# Patient Record
Sex: Female | Born: 1985 | Hispanic: Yes | Marital: Single | Smoking: Light tobacco smoker
Health system: Southern US, Community
[De-identification: ages and names within clinical notes are randomized; demographics above are authoritative.]

## PROBLEM LIST (undated history)

## (undated) HISTORY — PX: WISDOM TOOTH EXTRACTION: SHX21

---

## 2014-09-08 ENCOUNTER — Ambulatory Visit (INDEPENDENT_AMBULATORY_CARE_PROVIDER_SITE_OTHER): Payer: PRIVATE HEALTH INSURANCE | Admitting: Family Medicine

## 2014-09-08 VITALS — BP 100/70 | HR 89 | Temp 98.9°F | Resp 16 | Ht 60.0 in | Wt 161.5 lb

## 2014-09-08 DIAGNOSIS — M545 Low back pain, unspecified: Secondary | ICD-10-CM

## 2014-09-08 MED ORDER — PREDNISONE 20 MG PO TABS: ORAL_TABLET | ORAL | Status: AC

## 2014-09-08 MED ORDER — CYCLOBENZAPRINE HCL 5 MG PO TABS: 5.0000 mg | ORAL_TABLET | Freq: Every day | ORAL | Status: AC

## 2014-09-08 MED ORDER — CYCLOBENZAPRINE HCL 5 MG PO TABS: 5.0000 mg | ORAL_TABLET | Freq: Three times a day (TID) | ORAL | Status: DC | PRN

## 2014-09-08 MED ORDER — CYCLOBENZAPRINE HCL 5 MG PO TABS: 5.0000 mg | ORAL_TABLET | Freq: Every day | ORAL | Status: DC

## 2014-09-08 MED ORDER — PREDNISONE 20 MG PO TABS: ORAL_TABLET | ORAL | Status: DC

## 2014-09-08 NOTE — Progress Notes (Signed)
Patient ID: Tamara Munoz, female   DOB: 1985/08/12, 29 y.o.   MRN: [id number]   This chart was scribed for Elvina Sidle, MD by [facility], medical scribe at [facility].The patient was seen in exam room 08 and the patient's care was started at 6:09 PM.  Patient ID: Tamara Munoz MRN: [id number], DOB: February 15, 1986, 29 y.o. Date of Encounter: 09/08/2014  Primary Physician: No PCP Per Patient  Chief Complaint:  Chief Complaint  Patient presents with   Back Pain    x 2 days, no trauma   HPI:  Tamara Munoz is a 29 y.o. female who presents to [facility] complaining of centralized back pain that radiates down to her legs., onset two days ago. She does have a history of back injuries but no trauma. She woke up from a nap and she was unable to move due to the pain on Saturday. Sitting worsens her pain and movements relieves this but certain pains does worsen her pain. In the morning the pain radiated down her left leg and the afternoon pain radiated down to her right leg. She did take ibuprofen for relief. No menstrual periods due to medication. No urinary symptoms, abdominal pain chest pain, shortness of breath. She is an Print production planner, no heavy lifting.  History reviewed. No pertinent past medical history.   Home Meds: Prior to Admission medications   Not on File   Allergies: Not on File  History   Social History   Marital Status: Single    Spouse Name: N/A   Number of Children: N/A   Years of Education: N/A   Occupational History   Not on file.   Social History Main Topics   Smoking status: Light Tobacco Smoker   Smokeless tobacco: Never Used   Alcohol Use: 0.0 oz/week    0 Standard drinks or equivalent per week   Drug Use: Not on file   Sexual Activity: Not on file   Other Topics Concern   Not on file   Social History Narrative   No narrative on file    Review of Systems: Constitutional: negative for chills,  fever, night sweats, weight changes, or fatigue  HEENT: negative for vision changes, hearing loss, congestion, rhinorrhea, ST, epistaxis, or sinus pressure Cardiovascular: negative for chest pain or palpitations Respiratory: negative for hemoptysis, wheezing, shortness of breath, or cough Abdominal: negative for abdominal pain, nausea, vomiting, diarrhea, or constipation Dermatological: negative for rash Msk: positive for back pain. Neurologic: negative for headache, dizziness, or syncope All other systems reviewed and are otherwise negative with the exception to those above and in the HPI.  Physical Exam: Blood pressure 100/70, pulse 89, temperature 98.9 F (37.2 C), temperature source Oral, resp. rate 16, height 5' (1.524 m), weight 161 lb 8 oz (73.256 kg), SpO2 98 %., Body mass index is 31.54 kg/(m^2). General: Well developed, well nourished, in no acute distress. Head: Normocephalic, atraumatic, eyes without discharge, sclera non-icteric, nares are without discharge. Bilateral auditory canals clear, TM's are without perforation, pearly grey and translucent with reflective cone of light bilaterally. Oral cavity moist, posterior pharynx without exudate, erythema, peritonsillar abscess, or post nasal drip.  Neck: Supple. No thyromegaly. Full ROM. No lymphadenopathy. Lungs: Clear bilaterally to auscultation without wheezes, rales, or rhonchi. Breathing is unlabored. Heart: RRR with S1 S2. No murmurs, rubs, or gallops appreciated. Abdomen: Soft, non-tender, non-distended with normoactive bowel sounds. No hepatomegaly. No rebound/guarding. No obvious abdominal masses. Msk:  Strength and tone  normal for age. No tenderness in her back Extremities/Skin: Warm and dry. No clubbing or cyanosis. No edema. No rashes or suspicious lesions. Straight leg raise is normal bilaterally.  Neuro: Alert and oriented X 3. Moves all extremities spontaneously. Gait is normal. CNII-XII grossly in tact. Reflexes are  normal Psych:  Responds to questions appropriately with a normal affect.   Labs:  ASSESSMENT AND PLAN:  29 y.o. year old female with 48 hours of low back pain without precipitating cause. She has no symptoms suggesting pelvic inflammatory disease, urinary tract infection, or disc disease.  This chart was scribed in my presence and reviewed by me personally.    ICD-9-CM ICD-10-CM   1. Midline low back pain without sciatica 724.2 M54.5 cyclobenzaprine (FLEXERIL) 5 MG tablet     predniSONE (DELTASONE) 20 MG tablet     DISCONTINUED: cyclobenzaprine (FLEXERIL) 5 MG tablet     DISCONTINUED: predniSONE (DELTASONE) 20 MG tablet     Signed, Elvina Sidle, MD  Signed, Elvina Sidle, MD 09/08/2014 6:09 PM

## 2014-09-08 NOTE — Addendum Note (Signed)
Addended by: Elvina Sidle on: 09/08/2014 06:23 PM   Modules accepted: Orders

## 2014-09-08 NOTE — Patient Instructions (Signed)
Without specific localized findings and with spontaneous onset (no trauma fall) sees, I believe that the problem is an inflammatory process where the muscles of the back attached to the top of the posterior pelvis. This usually improves with medicine prescribed. Please call back if it is not significantly better in 48 hours

## 2014-10-09 ENCOUNTER — Ambulatory Visit (INDEPENDENT_AMBULATORY_CARE_PROVIDER_SITE_OTHER): Payer: PRIVATE HEALTH INSURANCE

## 2014-10-09 ENCOUNTER — Ambulatory Visit (INDEPENDENT_AMBULATORY_CARE_PROVIDER_SITE_OTHER): Payer: PRIVATE HEALTH INSURANCE | Admitting: Emergency Medicine

## 2014-10-09 VITALS — BP 102/78 | HR 125 | Temp 102.6°F | Resp 18 | Ht 60.0 in | Wt 163.0 lb

## 2014-10-09 DIAGNOSIS — R519 Headache, unspecified: Secondary | ICD-10-CM

## 2014-10-09 DIAGNOSIS — R509 Fever, unspecified: Secondary | ICD-10-CM

## 2014-10-09 DIAGNOSIS — R51 Headache: Secondary | ICD-10-CM

## 2014-10-09 DIAGNOSIS — R1084 Generalized abdominal pain: Secondary | ICD-10-CM | POA: Diagnosis not present

## 2014-10-09 LAB — POCT UA - MICROSCOPIC ONLY
Bacteria, U Microscopic: NEGATIVE
CRYSTALS, UR, HPF, POC: NEGATIVE
Casts, Ur, LPF, POC: NEGATIVE
Mucus, UA: NEGATIVE
Yeast, UA: NEGATIVE

## 2014-10-09 LAB — POCT URINALYSIS DIPSTICK
GLUCOSE UA: NEGATIVE
KETONES UA: 80
Leukocytes, UA: NEGATIVE
Nitrite, UA: NEGATIVE
PH UA: 5.5
Protein, UA: 100
Spec Grav, UA: 1.015
UROBILINOGEN UA: 0.2

## 2014-10-09 LAB — POCT CBC
Granulocyte percent: 80.5 %G — AB (ref 37–80)
HEMATOCRIT: 41.1 % (ref 37.7–47.9)
Hemoglobin: 13.5 g/dL (ref 12.2–16.2)
LYMPH, POC: 1.3 (ref 0.6–3.4)
MCH, POC: 28.7 pg (ref 27–31.2)
MCHC: 33 g/dL (ref 31.8–35.4)
MCV: 86.9 fL (ref 80–97)
MID (CBC): 0.3 (ref 0–0.9)
MPV: 7.2 fL (ref 0–99.8)
POC Granulocyte: 6.8 (ref 2–6.9)
POC LYMPH %: 15.8 % (ref 10–50)
POC MID %: 3.7 % (ref 0–12)
Platelet Count, POC: 241 10*3/uL (ref 142–424)
RBC: 4.73 M/uL (ref 4.04–5.48)
RDW, POC: 13.6 %
WBC: 8.5 10*3/uL (ref 4.6–10.2)

## 2014-10-09 NOTE — Patient Instructions (Signed)
Hyperthyroidism  The thyroid is a large gland located in the lower front part of your neck. The thyroid helps control metabolism. Metabolism is how your body uses food. It controls metabolism with the hormone thyroxine. When the thyroid is overactive, it produces too much hormone. When this happens, these following problems may occur:   · Nervousness  · Heat intolerance  · Weight loss (in spite of increase food intake)  · Diarrhea  · Change in hair or skin texture  · Palpitations (heart skipping or having extra beats)  · Tachycardia (rapid heart rate)  · Loss of menstruation (amenorrhea)  · Shaking of the hands  CAUSES  · Grave's Disease (the immune system attacks the thyroid gland). This is the most common cause.  · Inflammation of the thyroid gland.  · Tumor (usually benign) in the thyroid gland or elsewhere.  · Excessive use of thyroid medications (both prescription and 'natural').  · Excessive ingestion of Iodine.  DIAGNOSIS   To prove hyperthyroidism, your caregiver may do blood tests and ultrasound tests. Sometimes the signs are hidden. It may be necessary for your caregiver to watch this illness with blood tests, either before or after diagnosis and treatment.  TREATMENT  Short-term treatment  There are several treatments to control symptoms. Drugs called beta blockers may give some relief. Drugs that decrease hormone production will provide temporary relief in many people. These measures will usually not give permanent relief.  Definitive therapy  There are treatments available which can be discussed between you and your caregiver which will permanently treat the problem. These treatments range from surgery (removal of the thyroid), to the use of radioactive iodine (destroys the thyroid by radiation), to the use of antithyroid drugs (interfere with hormone synthesis). The first two treatments are permanent and usually successful. They most often require hormone replacement therapy for life. This is because  it is impossible to remove or destroy the exact amount of thyroid required to make a person euthyroid (normal).  HOME CARE INSTRUCTIONS   See your caregiver if the problems you are being treated for get worse. Examples of this would be the problems listed above.  SEEK MEDICAL CARE IF:  Your general condition worsens.  MAKE SURE YOU:   · Understand these instructions.  · Will watch your condition.  · Will get help right away if you are not doing well or get worse.  Document Released: 03/07/2005 Document Revised: 05/30/2011 Document Reviewed: 07/19/2006  Exit[employer]® Patient Information ©2015 [employer]. This information is not intended to replace advice given to you by your health care provider. Make sure you discuss any questions you have with your health care provider.

## 2014-10-09 NOTE — Progress Notes (Signed)
Subjective:  Patient ID: Tamara Munoz, female    DOB: 1985/09/18  Age: 29 y.o. MRN: [id number]  CC: Nausea; Chest Pain; Generalized Body Aches; and Chills   HPI Tamara Munoz presents   Is been experiencing difficulty moving her bowels. She's concerned about her thyroid as she is known to be hyperthyroid she's been off her medication to control her thyroid  A she is worried about symptoms of side effects. She has no blood in her stools  Black stools no nausea or vomiting.  She has no fever chills and otherwise feels well. She feels her abdomen is overly firm.  History Tamara Munoz has no past medical history on file.   She has past surgical history that includes Wisdom tooth extraction.   Her  family history includes Cancer in her maternal grandmother; Diabetes in her father and paternal grandmother.  She   reports that she has been smoking.  She has never used smokeless tobacco. She reports that she drinks alcohol. Her drug history is not on file.  Outpatient Prescriptions Prior to Visit  Medication Sig Dispense Refill  . cyclobenzaprine (FLEXERIL) 5 MG tablet Take 1 tablet (5 mg total) by mouth at bedtime. (Patient not taking: Reported on 10/09/2014) 10 tablet 0  . predniSONE (DELTASONE) 20 MG tablet Two daily with food (Patient not taking: Reported on 10/09/2014) 10 tablet 0   No facility-administered medications prior to visit.    History   Social History  . Marital Status: Single    Spouse Name: N/A  . Number of Children: N/A  . Years of Education: N/A   Social History Main Topics  . Smoking status: Light Tobacco Smoker  . Smokeless tobacco: Never Used  . Alcohol Use: 0.0 oz/week    0 Standard drinks or equivalent per week  . Drug Use: Not on file  . Sexual Activity: Not on file   Other Topics Concern  . None   Social History Narrative     Review of Systems  Constitutional: Negative for fever, chills and appetite change.  HENT: Negative for congestion, ear  pain, postnasal drip, sinus pressure and sore throat.   Eyes: Negative for pain and redness.  Respiratory: Negative for cough, shortness of breath and wheezing.   Cardiovascular: Negative for leg swelling.  Gastrointestinal: Negative for nausea, vomiting, abdominal pain, diarrhea, constipation and blood in stool.  Endocrine: Negative for polyuria.  Genitourinary: Negative for dysuria, urgency, frequency and flank pain.  Musculoskeletal: Negative for gait problem.  Skin: Negative for rash.  Neurological: Negative for weakness and headaches.  Psychiatric/Behavioral: Negative for confusion and decreased concentration. The patient is not nervous/anxious.     Objective:  BP 102/78 mmHg  Pulse 125  Temp(Src) 102.6 F (39.2 C) (Oral)  Resp 18  Ht 5' (1.524 m)  Wt 163 lb (73.936 kg)  BMI 31.83 kg/m2  SpO2 98%  Physical Exam  Constitutional: She is oriented to person, place, and time. She appears well-developed and well-nourished. No distress.  HENT:  Head: Normocephalic and atraumatic.  Right Ear: External ear normal.  Left Ear: External ear normal.  Nose: Nose normal.  Eyes: Conjunctivae and EOM are normal. Pupils are equal, round, and reactive to light. No scleral icterus.  Neck: Normal range of motion. Neck supple. No tracheal deviation present.  Cardiovascular: Normal rate, regular rhythm and normal heart sounds.   Pulmonary/Chest: Effort normal. No respiratory distress. She has no wheezes. She has no rales.  Abdominal: She exhibits no mass. There is no tenderness.  There is no rebound and no guarding.  Musculoskeletal: She exhibits no edema.  Lymphadenopathy:    She has no cervical adenopathy.  Neurological: She is alert and oriented to person, place, and time. Coordination normal.  Skin: Skin is warm and dry. No rash noted.  Psychiatric: She has a normal mood and affect. Her behavior is normal.      Assessment & Plan:   Tamara Munoz was seen today for nausea, chest pain,  generalized body aches and chills.  Diagnoses and all orders for this visit:  Generalized abdominal pain Orders: -     DG Abd 1 View; Future -     POCT CBC -     Cancel: Clostridium difficile EIA -     POCT UA - Microscopic Only -     POCT urinalysis dipstick -     COMPLETE METABOLIC PANEL WITH GFR -     TSH  Acute nonintractable headache, unspecified headache type Orders: -     DG Abd 1 View; Future -     POCT CBC -     Cancel: Clostridium difficile EIA -     POCT UA - Microscopic Only -     POCT urinalysis dipstick -     COMPLETE METABOLIC PANEL WITH GFR  Other specified fever Orders: -     DG Abd 1 View; Future -     POCT CBC -     Cancel: Clostridium difficile EIA -     POCT UA - Microscopic Only -     POCT urinalysis dipstick   I am having Ms. Penick maintain her predniSONE and cyclobenzaprine.  No orders of the defined types were placed in this encounter.    Appropriate red flag conditions were discussed with the patient as well as actions that should be taken.  Patient expressed his understanding.  Follow-up: Return if symptoms worsen or fail to improve.  Carmelina Dane, MD   Results for orders placed or performed in visit on 10/09/14  POCT CBC  Result Value Ref Range   WBC 8.5 4.6 - 10.2 K/uL   Lymph, poc 1.3 0.6 - 3.4   POC LYMPH PERCENT 15.8 10 - 50 %L   MID (cbc) 0.3 0 - 0.9   POC MID % 3.7 0 - 12 %M   POC Granulocyte 6.8 2 - 6.9   Granulocyte percent 80.5 (A) 37 - 80 %G   RBC 4.73 4.04 - 5.48 M/uL   Hemoglobin 13.5 12.2 - 16.2 g/dL   HCT, POC 40.1 02.7 - 47.9 %   MCV 86.9 80 - 97 fL   MCH, POC 28.7 27 - 31.2 pg   MCHC 33.0 31.8 - 35.4 g/dL   RDW, POC 25.3 %   Platelet Count, POC 241 142 - 424 K/uL   MPV 7.2 0 - 99.8 fL  POCT UA - Microscopic Only  Result Value Ref Range   WBC, Ur, HPF, POC 0-1    RBC, urine, microscopic 3-5    Bacteria, U Microscopic neg    Mucus, UA neg    Epithelial cells, urine per micros 4-6    Crystals, Ur,  HPF, POC neg    Casts, Ur, LPF, POC neg    Yeast, UA neg   POCT urinalysis dipstick  Result Value Ref Range   Color, UA dark yellow    Clarity, UA slightly cloudy    Glucose, UA neg    Bilirubin, UA small    Ketones, UA 80  Spec Grav, UA 1.015    Blood, UA large    pH, UA 5.5    Protein, UA 100    Urobilinogen, UA 0.2    Nitrite, UA neg    Leukocytes, UA Negative Negative     [facility] reading (PRIMARY) by  Dr.  Dareen Piano negative.

## 2014-10-10 LAB — COMPLETE METABOLIC PANEL WITH GFR
ALBUMIN: 4.3 g/dL (ref 3.5–5.2)
ALK PHOS: 66 U/L (ref 39–117)
ALT: 46 U/L — ABNORMAL HIGH (ref 0–35)
AST: 29 U/L (ref 0–37)
BUN: 11 mg/dL (ref 6–23)
CHLORIDE: 99 meq/L (ref 96–112)
CO2: 28 meq/L (ref 19–32)
Calcium: 9.5 mg/dL (ref 8.4–10.5)
Creat: 0.8 mg/dL (ref 0.50–1.10)
GFR, Est Non African American: >89 mL/min
Glucose, Bld: 101 mg/dL — ABNORMAL HIGH (ref 70–99)
Potassium: 3.3 mEq/L — ABNORMAL LOW (ref 3.5–5.3)
Sodium: 139 mEq/L (ref 135–145)
Total Bilirubin: 0.6 mg/dL (ref 0.2–1.2)
Total Protein: 7.6 g/dL (ref 6.0–8.3)

## 2014-10-10 LAB — TSH: TSH: 2.259 u[IU]/mL (ref 0.350–4.500)

## 2014-10-17 ENCOUNTER — Encounter: Payer: Self-pay | Admitting: Family Medicine

## 2016-03-23 IMAGING — CR DG ABDOMEN 1V
1 series · 1 of 1 positions shown · non-contrast
Comparison: None.

CLINICAL DATA: 29-year-old female with abdominal pain

EXAM:
ABDOMEN - 1 VIEW

[AP]
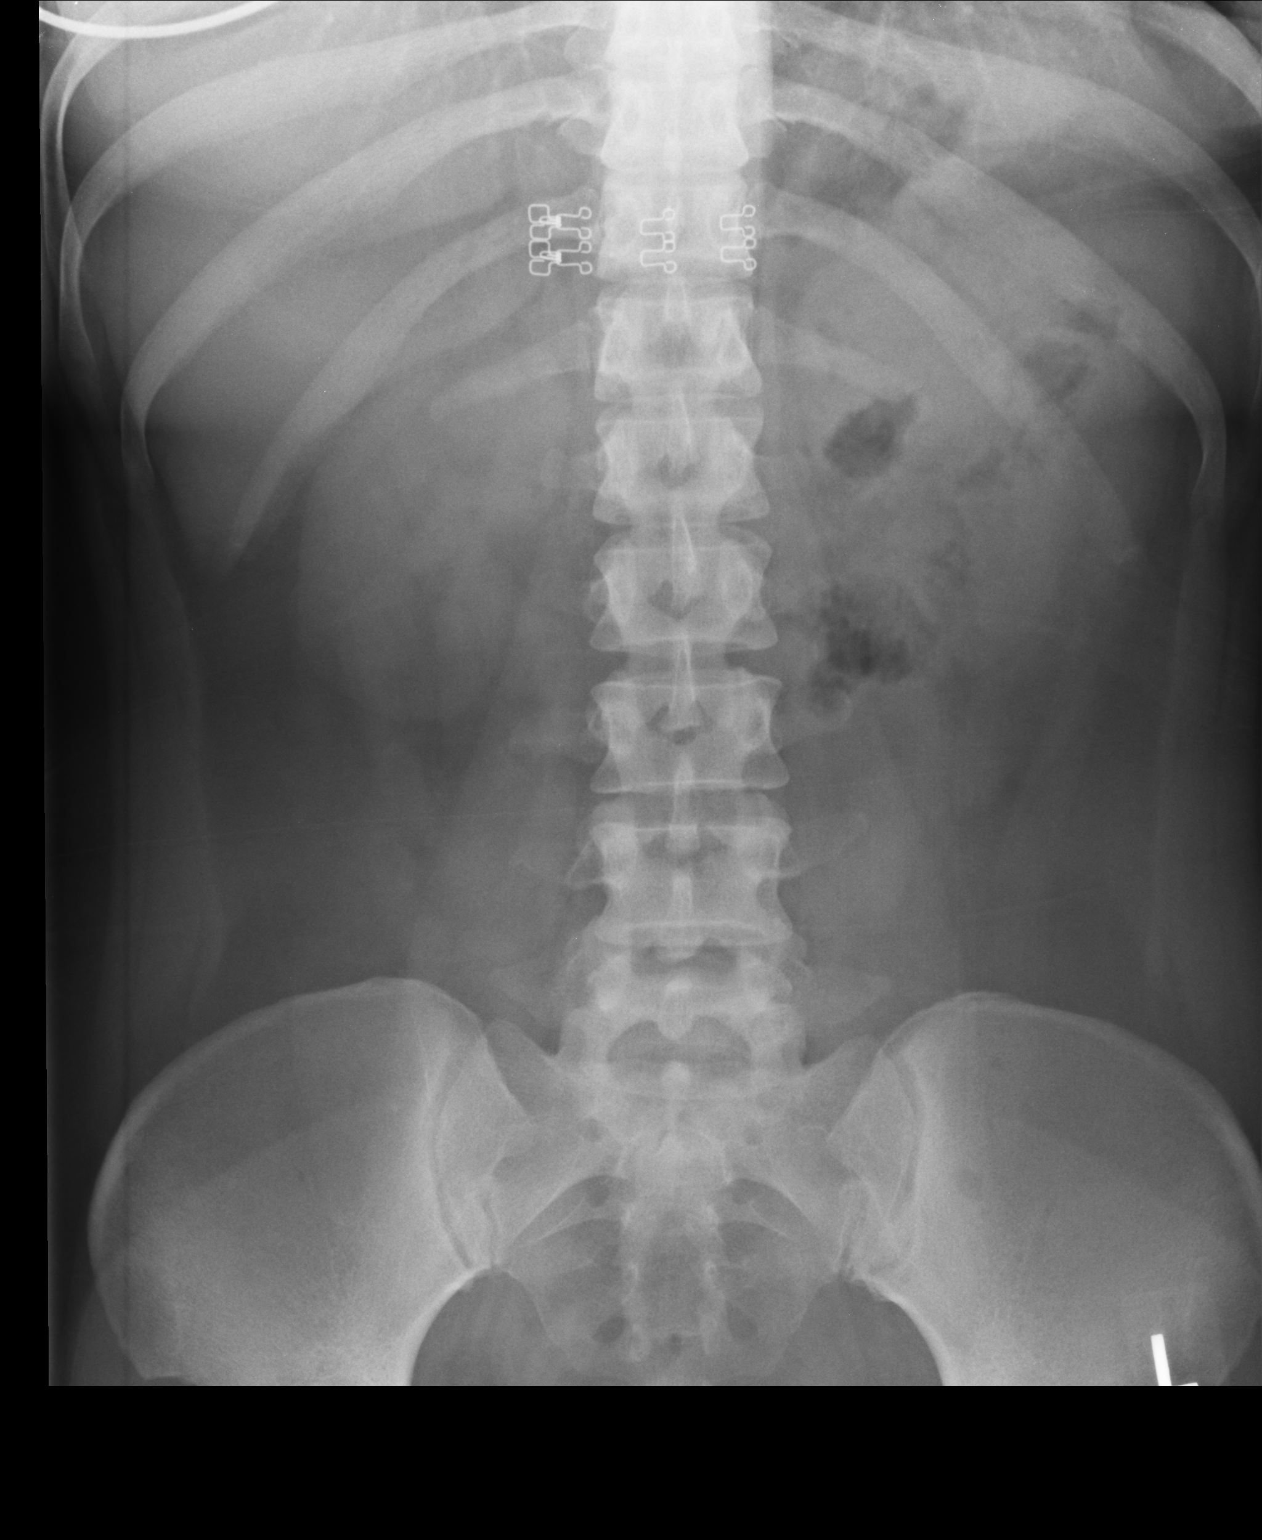

[1 of 1 positions shown; findings below may reference images not displayed]

FINDINGS: The bowel gas pattern is normal. No radio-opaque calculi or other
significant radiographic abnormality are seen.
IMPRESSION: Negative.
